# Patient Record
Sex: Male | Born: 1980
Health system: Southern US, Community
[De-identification: ages and names within clinical notes are randomized; demographics above are authoritative.]

## PROBLEM LIST (undated history)

## (undated) HISTORY — PX: WISDOM TOOTH EXTRACTION: SHX21

---

## 2002-10-15 ENCOUNTER — Emergency Department (HOSPITAL_COMMUNITY): Admission: EM | Admit: 2002-10-15 | Discharge: 2002-10-16 | Payer: Self-pay | Admitting: Emergency Medicine

## 2002-10-18 ENCOUNTER — Emergency Department (HOSPITAL_COMMUNITY): Admission: EM | Admit: 2002-10-18 | Discharge: 2002-10-18 | Payer: Self-pay | Admitting: Emergency Medicine

## 2002-11-01 ENCOUNTER — Emergency Department (HOSPITAL_COMMUNITY): Admission: EM | Admit: 2002-11-01 | Discharge: 2002-11-01 | Payer: Self-pay | Admitting: Emergency Medicine

## 2004-09-24 ENCOUNTER — Emergency Department (HOSPITAL_COMMUNITY): Admission: EM | Admit: 2004-09-24 | Discharge: 2004-09-24 | Payer: Self-pay | Admitting: Family Medicine

## 2004-09-25 ENCOUNTER — Emergency Department (HOSPITAL_COMMUNITY): Admission: EM | Admit: 2004-09-25 | Discharge: 2004-09-25 | Payer: Self-pay | Admitting: Family Medicine

## 2004-09-27 ENCOUNTER — Emergency Department (HOSPITAL_COMMUNITY): Admission: EM | Admit: 2004-09-27 | Discharge: 2004-09-27 | Payer: Self-pay | Admitting: Emergency Medicine

## 2008-08-28 ENCOUNTER — Emergency Department (HOSPITAL_COMMUNITY): Admission: EM | Admit: 2008-08-28 | Discharge: 2008-08-28 | Payer: Self-pay | Admitting: Emergency Medicine

## 2008-09-05 ENCOUNTER — Ambulatory Visit (HOSPITAL_BASED_OUTPATIENT_CLINIC_OR_DEPARTMENT_OTHER): Admission: RE | Admit: 2008-09-05 | Discharge: 2008-09-05 | Payer: Self-pay | Admitting: Otolaryngology
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
## 2014-12-22 ENCOUNTER — Ambulatory Visit
Admission: RE | Admit: 2014-12-22 | Discharge: 2014-12-22 | Disposition: A | Discharge status: Home / Self Care | Payer: 59 | Source: Ambulatory Visit | Attending: Internal Medicine | Admitting: Internal Medicine

## 2014-12-22 ENCOUNTER — Other Ambulatory Visit: Payer: Self-pay | Admitting: Internal Medicine

## 2014-12-22 DIAGNOSIS — Q5310 Unspecified undescended testicle, unilateral: Secondary | ICD-10-CM

## 2014-12-28 ENCOUNTER — Other Ambulatory Visit: Payer: Self-pay | Admitting: Internal Medicine

## 2014-12-28 DIAGNOSIS — Q5310 Unspecified undescended testicle, unilateral: Secondary | ICD-10-CM

## 2015-02-24 ENCOUNTER — Ambulatory Visit
Admission: RE | Admit: 2015-02-24 | Discharge: 2015-02-24 | Disposition: A | Discharge status: Home / Self Care | Payer: 59 | Source: Ambulatory Visit | Attending: Internal Medicine | Admitting: Internal Medicine

## 2015-02-24 DIAGNOSIS — Q5310 Unspecified undescended testicle, unilateral: Secondary | ICD-10-CM

## 2015-02-24 MED ORDER — IOPAMIDOL (ISOVUE-300) INJECTION 61%
100.0000 mL | Freq: Once | INTRAVENOUS | Status: AC | PRN
  Administered 2015-02-24: 100 mL via INTRAVENOUS

## 2015-02-26 ENCOUNTER — Other Ambulatory Visit: Payer: 59

## 2015-04-20 ENCOUNTER — Other Ambulatory Visit: Payer: Self-pay | Admitting: Urology

## 2015-05-15 NOTE — Patient Instructions (Signed)
Angelica Ranreston Jacober  05/15/2015   Your procedure is scheduled on: 05-19-15  [facility]  [facility]trance [facility]  elevators to 3rd floor to  Short Stay Center at  8:00 AM.  Call this number if you have problems the morning of surgery [phone]   Remember: ONLY 1 PERSON MAY GO WITH YOU TO SHORT STAY TO GET  READY MORNING OF YOUR SURGERY.  Do not eat food or drink liquids :After Midnight.     Take these medicines the morning of surgery with A SIP OF WATER: None DO NOT TAKE ANY DIABETIC MEDICATIONS DAY OF YOUR SURGERY                               You may not have any metal on your body including hair pins and              piercings  Do not wear jewelry,  lotions, powders or perfumes, deodorant                       Men may shave face and neck.   Do not bring valuables to the hospital. [facility] IS NOT             RESPONSIBLE   FOR VALUABLES.  Contacts, dentures or bridgework may not be worn into surgery.  Leave suitcase in the car. After surgery it may be brought to your room.       Special Instructions: coughing and deep breathing exercises, leg exercises              Please read over the following fact sheets you were given: _____________________________________________________________________             [facility] - Preparing for [facility] Before surgery, you can play an important role.  Because skin is not sterile, your skin needs to be as free of germs as possible.  You can reduce the number of germs on your skin by washing with CHG (chlorahexidine gluconate) soap before surgery.  CHG is an antiseptic cleaner which kills germs and bonds with the skin to continue killing germs even after washing. Please DO NOT use if you have an allergy to CHG or antibacterial soaps.  If your skin becomes reddened/irritated stop using the CHG and inform your nurse when you arrive at Short Stay. Do not shave (including legs and underarms) for at least 48 hours  prior to the first CHG shower.  You may shave your face/neck. Please follow these instructions carefully:  1.  Shower with CHG Soap the night before surgery and the  morning of Surgery.  2.  If you choose to wash your hair, wash your hair first as usual with your  normal  shampoo.  3.  After you shampoo, rinse your hair and body thoroughly to remove the  shampoo.                           4.  Use CHG as you would any other liquid soap.  You can apply chg directly  to the skin and wash                       Gently with a scrungie or clean washcloth.  5.  Apply the  CHG Soap to your body ONLY FROM THE NECK DOWN.   Do not use on face/ open                           Wound or open sores. Avoid contact with eyes, ears mouth and genitals (private parts).                       Wash face,  Genitals (private parts) with your normal soap.             6.  Wash thoroughly, paying special attention to the area where your surgery  will be performed.  7.  Thoroughly rinse your body with warm water from the neck down.  8.  DO NOT shower/wash with your normal soap after using and rinsing off  the CHG Soap.                9.  Pat yourself dry with a clean towel.            10.  Wear clean pajamas.            11.  Place clean sheets on your bed the night of your first shower and do not  sleep with pets. [name] : Do not apply any lotions/deodorants the morning of surgery.  Please wear clean clothes to the hospital/surgery center.  FAILURE TO FOLLOW THESE INSTRUCTIONS MAY RESULT IN THE CANCELLATION OF YOUR SURGERY PATIENT SIGNATURE_________________________________  NURSE SIGNATURE__________________________________  ________________________________________________________________________

## 2015-05-17 ENCOUNTER — Encounter (HOSPITAL_COMMUNITY)
Admission: RE | Admit: 2015-05-17 | Discharge: 2015-05-17 | Disposition: A | Discharge status: Home / Self Care | Payer: 59 | Source: Ambulatory Visit | Attending: Urology | Admitting: Urology

## 2015-05-17 ENCOUNTER — Encounter (HOSPITAL_COMMUNITY): Payer: Self-pay

## 2015-05-17 DIAGNOSIS — E785 Hyperlipidemia, unspecified: Secondary | ICD-10-CM | POA: Diagnosis not present

## 2015-05-17 DIAGNOSIS — F17200 Nicotine dependence, unspecified, uncomplicated: Secondary | ICD-10-CM | POA: Diagnosis not present

## 2015-05-17 DIAGNOSIS — Q5310 Unspecified undescended testicle, unilateral: Secondary | ICD-10-CM | POA: Diagnosis present

## 2015-05-17 LAB — CBC
HCT: 49.1 % (ref 39.0–52.0)
Hemoglobin: 16.6 g/dL (ref 13.0–17.0)
MCH: 30.1 pg (ref 26.0–34.0)
MCHC: 33.8 g/dL (ref 30.0–36.0)
MCV: 88.9 fL (ref 78.0–100.0)
PLATELETS: 200 10*3/uL (ref 150–400)
RBC: 5.52 MIL/uL (ref 4.22–5.81)
RDW: 12.7 % (ref 11.5–15.5)
WBC: 5.1 10*3/uL (ref 4.0–10.5)

## 2015-05-17 LAB — ABO/RH: ABO/RH(D): A POS

## 2015-05-17 NOTE — Progress Notes (Addendum)
02-24-15 - CT Abd/Pelvis - EPIC 12-22-14 - U/S Scrotum - EPIC

## 2015-05-19 ENCOUNTER — Ambulatory Visit (HOSPITAL_COMMUNITY): Payer: 59 | Admitting: Certified Registered Nurse Anesthetist

## 2015-05-19 ENCOUNTER — Encounter (HOSPITAL_COMMUNITY): Payer: Self-pay | Admitting: *Deleted

## 2015-05-19 ENCOUNTER — Encounter (HOSPITAL_COMMUNITY)
Admission: RE | Disposition: A | Discharge status: Home / Self Care | Payer: Self-pay | Source: Ambulatory Visit | Attending: Urology

## 2015-05-19 ENCOUNTER — Observation Stay (HOSPITAL_COMMUNITY)
Admission: RE | Admit: 2015-05-19 | Discharge: 2015-05-20 | Disposition: A | Discharge status: Home / Self Care | Payer: 59 | Source: Ambulatory Visit | Attending: Urology | Admitting: Urology

## 2015-05-19 DIAGNOSIS — Q539 Undescended testicle, unspecified: Secondary | ICD-10-CM

## 2015-05-19 DIAGNOSIS — Q5310 Unspecified undescended testicle, unilateral: Principal | ICD-10-CM | POA: Insufficient documentation

## 2015-05-19 DIAGNOSIS — E785 Hyperlipidemia, unspecified: Secondary | ICD-10-CM | POA: Insufficient documentation

## 2015-05-19 DIAGNOSIS — F17200 Nicotine dependence, unspecified, uncomplicated: Secondary | ICD-10-CM | POA: Insufficient documentation

## 2015-05-19 HISTORY — PX: ROBOTIC ORCHIECTOMY: SHX6209

## 2015-05-19 LAB — TYPE AND SCREEN
ABO/RH(D): A POS
Antibody Screen: NEGATIVE

## 2015-05-19 SURGERY — ROBOTIC ORCHIECTOMY
Anesthesia: General | Laterality: Left

## 2015-05-19 MED ORDER — SENNOSIDES-DOCUSATE SODIUM 8.6-50 MG PO TABS: 1.0000 | ORAL_TABLET | Freq: Two times a day (BID) | ORAL | Status: DC

## 2015-05-19 MED ORDER — ONDANSETRON HCL 4 MG/2ML IJ SOLN: 4.0000 mg | INTRAMUSCULAR | Status: DC | PRN

## 2015-05-19 MED ORDER — SODIUM CHLORIDE 0.9 % IV SOLN
250.0000 mL | INTRAVENOUS | Status: DC | PRN
  Administered 2015-05-20: 250 mL via INTRAVENOUS

## 2015-05-19 MED ORDER — LACTATED RINGERS IV SOLN: INTRAVENOUS | Status: DC

## 2015-05-19 MED ORDER — MIDAZOLAM HCL 2 MG/2ML IJ SOLN
INTRAMUSCULAR | Status: AC
  Filled 2015-05-19: qty 2

## 2015-05-19 MED ORDER — SODIUM CHLORIDE 0.9 % IJ SOLN: 3.0000 mL | Freq: Two times a day (BID) | INTRAMUSCULAR | Status: DC

## 2015-05-19 MED ORDER — SODIUM CHLORIDE 0.9 % IJ SOLN
INTRAMUSCULAR | Status: AC
  Filled 2015-05-19: qty 20

## 2015-05-19 MED ORDER — LIDOCAINE HCL (CARDIAC) 20 MG/ML IV SOLN
INTRAVENOUS | Status: AC
  Filled 2015-05-19: qty 5

## 2015-05-19 MED ORDER — LACTATED RINGERS IR SOLN
Status: DC | PRN
  Administered 2015-05-19: 1000 mL

## 2015-05-19 MED ORDER — DEXTROSE IN LACTATED RINGERS 5 % IV SOLN
INTRAVENOUS | Status: DC
  Administered 2015-05-19: 16:00:00 via INTRAVENOUS

## 2015-05-19 MED ORDER — CEFAZOLIN SODIUM 1-5 GM-% IV SOLN
1.0000 g | Freq: Three times a day (TID) | INTRAVENOUS | Status: AC
  Administered 2015-05-19 – 2015-05-20 (×2): 1 g via INTRAVENOUS
  Filled 2015-05-19 (×2): qty 50

## 2015-05-19 MED ORDER — ONDANSETRON HCL 4 MG/2ML IJ SOLN: 4.0000 mg | Freq: Once | INTRAMUSCULAR | Status: DC | PRN

## 2015-05-19 MED ORDER — ROCURONIUM BROMIDE 100 MG/10ML IV SOLN
INTRAVENOUS | Status: AC
  Filled 2015-05-19: qty 1

## 2015-05-19 MED ORDER — SUCCINYLCHOLINE CHLORIDE 20 MG/ML IJ SOLN
INTRAMUSCULAR | Status: DC | PRN
  Administered 2015-05-19: 100 mg via INTRAVENOUS

## 2015-05-19 MED ORDER — SENNOSIDES-DOCUSATE SODIUM 8.6-50 MG PO TABS
2.0000 | ORAL_TABLET | Freq: Every day | ORAL | Status: DC
  Administered 2015-05-19: 2 via ORAL
  Filled 2015-05-19: qty 2

## 2015-05-19 MED ORDER — ONDANSETRON HCL 4 MG/2ML IJ SOLN
INTRAMUSCULAR | Status: AC
  Filled 2015-05-19: qty 2

## 2015-05-19 MED ORDER — FENTANYL CITRATE (PF) 100 MCG/2ML IJ SOLN
INTRAMUSCULAR | Status: DC | PRN
  Administered 2015-05-19: 50 ug via INTRAVENOUS
  Administered 2015-05-19: 100 ug via INTRAVENOUS
  Administered 2015-05-19 (×2): 50 ug via INTRAVENOUS

## 2015-05-19 MED ORDER — DEXAMETHASONE SODIUM PHOSPHATE 10 MG/ML IJ SOLN
INTRAMUSCULAR | Status: AC
  Filled 2015-05-19: qty 1

## 2015-05-19 MED ORDER — FENTANYL CITRATE (PF) 100 MCG/2ML IJ SOLN
25.0000 ug | INTRAMUSCULAR | Status: DC | PRN
  Administered 2015-05-19 (×3): 50 ug via INTRAVENOUS

## 2015-05-19 MED ORDER — DOXYCYCLINE HYCLATE 100 MG PO TABS
100.0000 mg | ORAL_TABLET | Freq: Two times a day (BID) | ORAL | Status: DC
  Administered 2015-05-19 – 2015-05-20 (×3): 100 mg via ORAL
  Filled 2015-05-19 (×3): qty 1

## 2015-05-19 MED ORDER — FENTANYL CITRATE (PF) 100 MCG/2ML IJ SOLN
INTRAMUSCULAR | Status: AC
  Filled 2015-05-19: qty 2

## 2015-05-19 MED ORDER — ROCURONIUM BROMIDE 100 MG/10ML IV SOLN
INTRAVENOUS | Status: DC | PRN
  Administered 2015-05-19: 35 mg via INTRAVENOUS
  Administered 2015-05-19: 5 mg via INTRAVENOUS
  Administered 2015-05-19: 20 mg via INTRAVENOUS

## 2015-05-19 MED ORDER — HYDROMORPHONE HCL 1 MG/ML IJ SOLN
INTRAMUSCULAR | Status: DC | PRN
  Administered 2015-05-19 (×2): 0.5 mg via INTRAVENOUS
  Administered 2015-05-19: 1 mg via INTRAVENOUS

## 2015-05-19 MED ORDER — ONDANSETRON HCL 4 MG/2ML IJ SOLN
INTRAMUSCULAR | Status: DC | PRN
  Administered 2015-05-19: 4 mg via INTRAVENOUS

## 2015-05-19 MED ORDER — CEFAZOLIN SODIUM-DEXTROSE 2-3 GM-% IV SOLR
INTRAVENOUS | Status: AC
  Filled 2015-05-19: qty 50

## 2015-05-19 MED ORDER — POLYETHYLENE GLYCOL 3350 17 G PO PACK: 17.0000 g | PACK | Freq: Every day | ORAL | Status: DC | PRN

## 2015-05-19 MED ORDER — HYDROMORPHONE HCL 2 MG/ML IJ SOLN
INTRAMUSCULAR | Status: AC
  Filled 2015-05-19: qty 1

## 2015-05-19 MED ORDER — BACITRACIN-NEOMYCIN-POLYMYXIN 400-5-5000 EX OINT: 1.0000 "application " | TOPICAL_OINTMENT | Freq: Three times a day (TID) | CUTANEOUS | Status: DC | PRN

## 2015-05-19 MED ORDER — ACETAMINOPHEN 10 MG/ML IV SOLN
1000.0000 mg | Freq: Four times a day (QID) | INTRAVENOUS | Status: AC
  Administered 2015-05-19 – 2015-05-20 (×4): 1000 mg via INTRAVENOUS
  Filled 2015-05-19 (×5): qty 100

## 2015-05-19 MED ORDER — FENTANYL CITRATE (PF) 250 MCG/5ML IJ SOLN
INTRAMUSCULAR | Status: AC
  Filled 2015-05-19: qty 5

## 2015-05-19 MED ORDER — SUGAMMADEX SODIUM 200 MG/2ML IV SOLN
INTRAVENOUS | Status: DC | PRN
  Administered 2015-05-19: 160 mg via INTRAVENOUS

## 2015-05-19 MED ORDER — OXYCODONE-ACETAMINOPHEN 5-325 MG PO TABS: 1.0000 | ORAL_TABLET | ORAL | Status: DC | PRN

## 2015-05-19 MED ORDER — LIDOCAINE HCL (CARDIAC) 20 MG/ML IV SOLN
INTRAVENOUS | Status: DC | PRN
  Administered 2015-05-19: 100 mg via INTRAVENOUS

## 2015-05-19 MED ORDER — SUGAMMADEX SODIUM 200 MG/2ML IV SOLN
INTRAVENOUS | Status: AC
  Filled 2015-05-19: qty 2

## 2015-05-19 MED ORDER — PROPOFOL 10 MG/ML IV BOLUS
INTRAVENOUS | Status: DC | PRN
Start: 2015-05-19 — End: 2015-05-19
  Administered 2015-05-19: 200 mg via INTRAVENOUS

## 2015-05-19 MED ORDER — DEXAMETHASONE SODIUM PHOSPHATE 10 MG/ML IJ SOLN
INTRAMUSCULAR | Status: DC | PRN
  Administered 2015-05-19: 10 mg via INTRAVENOUS

## 2015-05-19 MED ORDER — BUPIVACAINE LIPOSOME 1.3 % IJ SUSP
20.0000 mL | Freq: Once | INTRAMUSCULAR | Status: AC
  Administered 2015-05-19: 20 mL
  Filled 2015-05-19: qty 20

## 2015-05-19 MED ORDER — OXYCODONE HCL 5 MG PO TABS
5.0000 mg | ORAL_TABLET | ORAL | Status: DC | PRN
  Administered 2015-05-19 – 2015-05-20 (×2): 10 mg via ORAL
  Filled 2015-05-19 (×3): qty 2

## 2015-05-19 MED ORDER — MIDAZOLAM HCL 5 MG/5ML IJ SOLN
INTRAMUSCULAR | Status: DC | PRN
  Administered 2015-05-19: 2 mg via INTRAVENOUS

## 2015-05-19 MED ORDER — LACTATED RINGERS IV SOLN
INTRAVENOUS | Status: DC | PRN
  Administered 2015-05-19 (×2): via INTRAVENOUS

## 2015-05-19 MED ORDER — PROPOFOL 10 MG/ML IV BOLUS
INTRAVENOUS | Status: AC
  Filled 2015-05-19: qty 20

## 2015-05-19 MED ORDER — CEFAZOLIN SODIUM-DEXTROSE 2-3 GM-% IV SOLR
2.0000 g | INTRAVENOUS | Status: AC
  Administered 2015-05-19: 2 g via INTRAVENOUS

## 2015-05-19 MED ORDER — MORPHINE SULFATE (PF) 10 MG/ML IV SOLN
2.0000 mg | INTRAVENOUS | Status: DC | PRN
  Administered 2015-05-19: 2 mg via INTRAVENOUS
  Filled 2015-05-19 (×2): qty 1

## 2015-05-19 MED ORDER — FENTANYL CITRATE (PF) 100 MCG/2ML IJ SOLN
INTRAMUSCULAR | Status: AC
Start: 2015-05-19 — End: 2015-05-20
  Filled 2015-05-19: qty 2

## 2015-05-19 MED ORDER — SODIUM CHLORIDE 0.9 % IJ SOLN: 3.0000 mL | INTRAMUSCULAR | Status: DC | PRN

## 2015-05-19 SURGICAL SUPPLY — 57 items
BAG RETRIEVAL 10 (BASKET)
BAG RETRIEVAL 10MM (BASKET)
CATH ROBINSON RED A/P 8FR (CATHETERS) IMPLANT
CHLORAPREP W/TINT 26ML (MISCELLANEOUS) ×3 IMPLANT
CLIP LIGATING HEM O LOK PURPLE (MISCELLANEOUS) ×6 IMPLANT
CLIP LIGATING HEMOLOK MED (MISCELLANEOUS) IMPLANT
CORD HIGH FREQUENCY UNIPOLAR (ELECTROSURGICAL) ×3 IMPLANT
COVER SURGICAL LIGHT HANDLE (MISCELLANEOUS) ×3 IMPLANT
COVER TIP SHEARS 8 DVNC (MISCELLANEOUS) ×1 IMPLANT
COVER TIP SHEARS 8MM DA VINCI (MISCELLANEOUS) ×2
CUTTER ECHEON FLEX ENDO 45 340 (ENDOMECHANICALS) IMPLANT
DECANTER SPIKE VIAL GLASS SM (MISCELLANEOUS) IMPLANT
DRAPE ARM DVNC X/XI (DISPOSABLE) ×4 IMPLANT
DRAPE COLUMN DVNC XI (DISPOSABLE) ×1 IMPLANT
DRAPE DA VINCI XI ARM (DISPOSABLE) ×8
DRAPE DA VINCI XI COLUMN (DISPOSABLE) ×2
DRAPE INCISE IOBAN 66X45 STRL (DRAPES) IMPLANT
DRAPE SHEET LG 3/4 BI-LAMINATE (DRAPES) ×6 IMPLANT
DRAPE SURG IRRIG POUCH 19X23 (DRAPES) ×3 IMPLANT
DRAPE TABLE BACK 44X90 PK DISP (DRAPES) IMPLANT
DRAPE WARM FLUID 44X44 (DRAPE) IMPLANT
DRSG TEGADERM 6X8 (GAUZE/BANDAGES/DRESSINGS) IMPLANT
ELECT REM PT RETURN 9FT ADLT (ELECTROSURGICAL) ×3
ELECTRODE REM PT RTRN 9FT ADLT (ELECTROSURGICAL) ×1 IMPLANT
GAUZE SPONGE 4X4 12PLY STRL (GAUZE/BANDAGES/DRESSINGS) IMPLANT
GLOVE BIO SURGEON STRL SZ 6.5 (GLOVE) ×2 IMPLANT
GLOVE BIO SURGEONS STRL SZ 6.5 (GLOVE) ×1
GLOVE BIOGEL M STRL SZ7.5 (GLOVE) ×6 IMPLANT
GOWN STRL REUS W/TWL LRG LVL3 (GOWN DISPOSABLE) ×6 IMPLANT
HOLDER FOLEY CATH W/STRAP (MISCELLANEOUS) ×3 IMPLANT
IV LACTATED RINGERS 1000ML (IV SOLUTION) IMPLANT
LUBRICANT JELLY K Y 4OZ (MISCELLANEOUS) IMPLANT
NDL SAFETY ECLIPSE 18X1.5 (NEEDLE) IMPLANT
NEEDLE HYPO 18GX1.5 SHARP (NEEDLE)
NEEDLE INSUFFLATION 14GA 120MM (NEEDLE) ×3 IMPLANT
RELOAD GREEN ECHELON 45 (STAPLE) IMPLANT
SEAL CANN UNIV 5-8 DVNC XI (MISCELLANEOUS) ×4 IMPLANT
SEAL XI 5MM-8MM UNIVERSAL (MISCELLANEOUS) ×8
SET TUBE IRRIG SUCTION NO TIP (IRRIGATION / IRRIGATOR) ×3 IMPLANT
SHEET LAVH (DRAPES) ×3 IMPLANT
SOLUTION ANTI FOG 6CC (MISCELLANEOUS) IMPLANT
SOLUTION ELECTROLUBE (MISCELLANEOUS) ×3 IMPLANT
SUT MON AB 4-0 RB1 27 (SUTURE) ×6 IMPLANT
SUT VIC AB 4-0 RB1 27 (SUTURE) ×2
SUT VIC AB 4-0 RB1 27XBRD (SUTURE) ×1 IMPLANT
SUT VICRYL 0 UR6 27IN ABS (SUTURE) ×6 IMPLANT
SYR 27GX1/2 1ML LL SAFETY (SYRINGE) IMPLANT
SYS BAG RETRIEVAL 10MM (BASKET)
SYSTEM BAG RETRIEVAL 10MM (BASKET) IMPLANT
TOWEL OR 17X26 10 PK STRL BLUE (TOWEL DISPOSABLE) ×3 IMPLANT
TOWEL OR NON WOVEN STRL DISP B (DISPOSABLE) ×3 IMPLANT
TRAY FOLEY W/METER SILVER 14FR (SET/KITS/TRAYS/PACK) IMPLANT
TRAY FOLEY W/METER SILVER 16FR (SET/KITS/TRAYS/PACK) ×3 IMPLANT
TRAY LAPAROSCOPIC (CUSTOM PROCEDURE TRAY) ×3 IMPLANT
TROCAR XCEL 12X100 BLDLESS (ENDOMECHANICALS) ×3 IMPLANT
TUBING INSUFFLATION 10FT LAP (TUBING) ×3 IMPLANT
WATER STERILE IRR 1500ML POUR (IV SOLUTION) IMPLANT

## 2015-05-19 NOTE — H&P (Signed)
Jeremiah Morrison is an 34 y.o. male.    Chief Complaint: Pre-Op Left Robotic Orchiectomy  HPI:   1 - Post-pubertal left undescended testis - pt with lifelong left cryptochidism. Scrotal US 12/2014 confirmed normal volume right testicle in scrotum  / non in left and left non-palpable. CT abd-pelvis 02/2015 with likely very small left testicle near external iliacs vessels on left. No retroperitoneal adenopathy. He has healthy daughter and normal secondary sexual charectaristics.   PMH sig for mild HLD. No blood thinners. No prior surgeires. His PCP is Dr. Gust BroomsJaralla with [facility].  Today " Jeremiah Morrison " is seen to proceed with left robotic orchiectomy of his in situ insta-abdominal testicle.   No past medical history on file.  Past Surgical History  Procedure Laterality Date  . Wisdom tooth extraction      No family history on file. Social History:  reports that he has been smoking.  He has never used smokeless tobacco. He reports that he does not drink alcohol or use illicit drugs.  Allergies: No Known Allergies  No prescriptions prior to admission    Results for orders placed or performed during the hospital encounter of 05/17/15 (from the past 48 hour(s))  Type and screen All Cardiac and thoracic surgeries, spinal fusions, myomectomies, craniotomies, colon & liver resections, total joint revisions, same day c-section with placenta previa or accreta.     Status: None   Collection Time: 05/17/15  9:55 AM  Result Value Ref Range   ABO/RH(D) A POS    Antibody Screen NEG    Sample Expiration 05/22/2015    Extend sample reason NO TRANSFUSIONS OR PREGNANCY IN THE PAST 3 MONTHS   ABO/Rh     Status: None   Collection Time: 05/17/15  9:55 AM  Result Value Ref Range   ABO/RH(D) A POS   CBC     Status: None   Collection Time: 05/17/15 10:00 AM  Result Value Ref Range   WBC 5.1 4.0 - 10.5 K/uL   RBC 5.52 4.22 - 5.81 MIL/uL   Hemoglobin 16.6 13.0 - 17.0 g/dL   HCT 16.149.1 09.639.0 - 04.552.0 %   MCV 88.9  78.0 - 100.0 fL   MCH 30.1 26.0 - 34.0 pg   MCHC 33.8 30.0 - 36.0 g/dL   RDW 40.912.7 81.111.5 - 91.415.5 %   Platelets 200 150 - 400 K/uL   No results found.  Review of Systems  Constitutional: Negative.   HENT: Negative.   Eyes: Negative.   Respiratory: Negative.   Cardiovascular: Negative.   Gastrointestinal: Negative.   Genitourinary: Negative.   Musculoskeletal: Negative.   Skin: Negative.   Neurological: Negative.   Endo/Heme/Allergies: Negative.   Psychiatric/Behavioral: Negative.     There were no vitals taken for this visit. Physical Exam  Constitutional: He appears well-developed.  HENT:  Head: Normocephalic.  Eyes: Pupils are equal, round, and reactive to light.  Neck: Normal range of motion.  Cardiovascular: Normal rate.   Respiratory: Effort normal.  GI: Soft.  Genitourinary:  Left testis NOT palpable in scrotum  Musculoskeletal: Normal range of motion.  Neurological: He is alert.  Skin: Skin is warm.  Psychiatric: He has a normal mood and affect. His behavior is normal. Judgment and thought content normal.     Assessment/Plan  1 - Post-pubertal left undescended testis - We rediscussed management of post-pubertal cryptorchid testis with the estimated >1/20 risk of future malignancy (mostly seminoma) which can be difficult to detect and may present at late stage due  to inability to perform self exam as well as the very low probability <1/20 of spermatogenic function and the rationale for management with orchiectomy preferred in unilateral cases with annual [place] surveillance until age 65 as another option.  We rediscussed that overall endocrine function tends to be completely preserved following unilateral orchiectomy in this setting and that fertility would be essentially unchanged as undescended gonads typically have no spermatogenesis present. We briefly rediscussed approaches to orchiectomy including inguinal approach for palpable gonads v. abdominal (robotic) for  non-palpable but confirmed gonads.    After consideration of options, the patient has opted for elective left laparoscopic / robotic orchiectomy today as planned.  Jeremiah Morrison 05/19/2015, 6:57 AM

## 2015-05-19 NOTE — Progress Notes (Signed)
[name] Subjective: Patient reports pain is moderately well controlled. Denies N/V. Tolerating clears. Has not ambulated yet. Voiding spontaneously.  Objective: Vital signs in last 24 hours: Temp:  [97.6 F (36.4 C)-98.1 F (36.7 C)] 97.6 F (36.4 C) (12/30 1300) Pulse Rate:  [76-85] 76 (12/30 1300) Resp:  [11-19] 14 (12/30 1300) BP: (112-127)/(66-82) 123/75 mmHg (12/30 1300) SpO2:  [97 %-100 %] 98 % (12/30 1300) Weight:  [78.926 kg (174 lb)] 78.926 kg (174 lb) (12/30 0844)  Intake/Output from previous day:   Intake/Output this shift: Total I/O In: 1400 [I.V.:1400] Out: 526 [Urine:526]  Physical Exam:  General:alert, cooperative and appears stated age GI: soft, appropriately tender to palpation, incisions C/D/I.   Male genitalia: no penile lesions or discharge Extremities: extremities normal, atraumatic, no cyanosis or edema  Lab Results:  Recent Labs  05/17/15 1000  HGB 16.6  HCT 49.1   BMET No results for input(s): NA, K, CL, CO2, GLUCOSE, BUN, CREATININE, CALCIUM in the last 72 hours. No results for input(s): LABPT, INR in the last 72 hours. No results for input(s): LABURIN in the last 72 hours. No results found for this or any previous visit.  Studies/Results: No results found.  Assessment/Plan: [name] Procedure(s) (LRB): ROBOTIC ORCHIECTOMY AND LIMITED LYMPH NODE DISSECTION (Left)  POD#0 left robotic orchiectomy and limited left pelvic lymph node dissection.    Clears, advance as tolerated  mIVF  Ambulate tonight  Labs tomorrow  Plan for discharge tomorrow   Jeremiah Morrison 05/19/2015, 3:12 PM   I have seen and examined the patient and agree with above assesment and plan as outlined here and in DC summary that I authored on same date.

## 2015-05-19 NOTE — Transfer of Care (Signed)
Immediate Anesthesia Transfer of Care Note  Patient: Jeremiah Morrison  Procedure(s) Performed: Procedure(s): ROBOTIC ORCHIECTOMY AND LIMITED LYMPH NODE DISSECTION (Left)  Patient Location: PACU  Anesthesia Type:General  Level of Consciousness:  sedated, patient cooperative and responds to stimulation  Airway & Oxygen Therapy:Patient Spontanous Breathing and Patient connected to face mask oxgen  Post-op Assessment:  Report given to PACU RN and Post -op Vital signs reviewed and stable  Post vital signs:  Reviewed and stable  Last Vitals:  Filed Vitals:   05/19/15 0803  BP: 127/72  Pulse: 83  Temp: 36.7 C  Resp: 18    Complications: No apparent anesthesia complications

## 2015-05-19 NOTE — Anesthesia Procedure Notes (Signed)
Procedure Name: Intubation Date/Time: 05/19/2015 10:24 AM Performed by: Orest DikesPETERS, Geordie Nooney J Pre-anesthesia Checklist: Patient identified, Emergency Drugs available, Suction available and Patient being monitored Patient Re-evaluated:Patient Re-evaluated prior to inductionOxygen Delivery Method: Circle system utilized Preoxygenation: Pre-oxygenation with 100% oxygen Intubation Type: IV induction Ventilation: Mask ventilation without difficulty Laryngoscope Size: Miller and 2 Grade View: Grade I Tube type: Oral Tube size: 7.5 mm Number of attempts: 1 Airway Equipment and Method: Stylet Placement Confirmation: ETT inserted through vocal cords under direct vision,  positive ETCO2 and breath sounds checked- equal and bilateral Secured at: 24 cm Tube secured with: Tape Dental Injury: Teeth and Oropharynx as per pre-operative assessment

## 2015-05-19 NOTE — Anesthesia Postprocedure Evaluation (Signed)
Anesthesia Post Note  Patient: Jeremiah Morrison  Procedure(s) Performed: Procedure(s) (LRB): ROBOTIC ORCHIECTOMY AND LIMITED LYMPH NODE DISSECTION (Left)  Patient location during evaluation: PACU Anesthesia Type: General Level of consciousness: awake and alert Pain management: pain level controlled Vital Signs Assessment: post-procedure vital signs reviewed and stable Respiratory status: spontaneous breathing, nonlabored ventilation, respiratory function stable and patient connected to nasal cannula oxygen Cardiovascular status: blood pressure returned to baseline and stable Postop Assessment: no signs of nausea or vomiting Anesthetic complications: no    Last Vitals:  Filed Vitals:   05/19/15 1203 05/19/15 1215  BP: 126/80 121/78  Pulse: 85 76  Temp: 36.6 C   Resp: 19 14    Last Pain:  Filed Vitals:   05/19/15 1227  PainSc: 7                  Lucine Bilski JENNETTE

## 2015-05-19 NOTE — Discharge Summary (Signed)
Physician Discharge Summary  Patient ID: Jeremiah Morrison MRN: [id number] DOB/AGE: 35/12/1980 35 y.o.  Admit date: 05/19/2015 Discharge date: 05/19/2015  Admission Diagnoses: Undescended, non-palpable left testicle  Discharge Diagnoses:  Active Problems:   Undescended testes   Discharged Condition: good  Hospital Course:  34 yo male who is s/p robotic assisted left orchiectomy and left limited pelvic lymph node dissection with Dr. Berneice HeinrichManny. He did well post-operatively. His diet was slowly advanced and at the time of discharge he was tolerating a regular diet, ambulating at his baseline, was voiding spontaneously without difficulty, and pain was well controlled with oral narcotics. He was discharged to home on POD#1.Final pathology pending at discharge.  Consults: None  Significant Diagnostic Studies: none  Treatments: surgery:  robotic assisted left orchiectomy and left limited pelvic lymph node dissection  Discharge Exam: Blood pressure 127/72, pulse 83, temperature 98 F (36.7 C), temperature source Oral, resp. rate 18, height 5\' 10"  (1.778 m), weight 78.926 kg (174 lb), SpO2 98 %. General appearance: alert, cooperative and appears stated age Head: Normocephalic, without obvious abnormality, atraumatic Eyes: negative Nose: Nares normal. Septum midline. Mucosa normal. No drainage or sinus tenderness. Throat: lips, mucosa, and tongue normal; teeth and gums normal Neck: supple, symmetrical, trachea midline Back: symmetric, no curvature. ROM normal. No CVA tenderness. Resp: non-labored on room air Cardio: Nl rate GI: soft, non-tender; bowel sounds normal; no masses,  no organomegaly Male genitalia: normal Extremities: extremities normal, atraumatic, no cyanosis or edema Pulses: 2+ and symmetric Skin: Skin color, texture, turgor normal. No rashes or lesions Lymph nodes: Cervical, supraclavicular, and axillary nodes normal. Neurologic: Grossly normal Incision/Wound: recent port  sites c/d/i. No hernias. Left hemiscrotum empty w/o ecchymoses / hematoma.   Disposition: Final discharge disposition not confirmed  Discharge Instructions    Call MD for:  difficulty breathing, headache or visual disturbances    Complete by:  As directed      Call MD for:  extreme fatigue    Complete by:  As directed      Call MD for:  hives    Complete by:  As directed      Call MD for:  persistant dizziness or light-headedness    Complete by:  As directed      Call MD for:  persistant nausea and vomiting    Complete by:  As directed      Call MD for:  redness, tenderness, or signs of infection (pain, swelling, redness, odor or green/yellow discharge around incision site)    Complete by:  As directed      Call MD for:  severe uncontrolled pain    Complete by:  As directed      Call MD for:  temperature >100.4    Complete by:  As directed      Diet general    Complete by:  As directed      Increase activity slowly    Complete by:  As directed           Take  Percocet and Senakot as prescribed as needed.      Follow-up Information    Follow up with Sebastian AcheMANNY, Selena Swaminathan, MD On 06/06/2015.   Specialty:  Urology   Why:  at 7:45 for MD visit. Dr. Berneice HeinrichManny will call you wtih pathology results when available.    Contact information:   [address] [place] [address] [phone]       Signed: Darron Doomlysia S Spencer 05/19/2015, 11:52 AM

## 2015-05-19 NOTE — Anesthesia Preprocedure Evaluation (Signed)
Anesthesia Evaluation  Patient identified by MRN, date of birth, ID band Patient awake    Reviewed: Allergy & Precautions, NPO status , Patient's Chart, lab work & pertinent test results  History of Anesthesia Complications Negative for: history of anesthetic complications  Airway Mallampati: II  TM Distance: >3 FB Neck ROM: Full    Dental no notable dental hx. (+) Dental Advisory Given   Pulmonary Current Smoker,    Pulmonary exam normal breath sounds clear to auscultation       Cardiovascular negative cardio ROS Normal cardiovascular exam Rhythm:Regular Rate:Normal     Neuro/Psych negative neurological ROS  negative psych ROS   GI/Hepatic negative GI ROS, Neg liver ROS,   Endo/Other  negative endocrine ROS  Renal/GU negative Renal ROS  negative genitourinary   Musculoskeletal negative musculoskeletal ROS (+)   Abdominal   Peds negative pediatric ROS (+)  Hematology negative hematology ROS (+)   Anesthesia Other Findings   Reproductive/Obstetrics negative OB ROS                             Anesthesia Physical Anesthesia Plan  ASA: II  Anesthesia Plan: General   Post-op Pain Management:    Induction: Intravenous  Airway Management Planned: Oral ETT  Additional Equipment:   Intra-op Plan:   Post-operative Plan: Extubation in OR  Informed Consent: I have reviewed the patients History and Physical, chart, labs and discussed the procedure including the risks, benefits and alternatives for the proposed anesthesia with the patient or authorized representative who has indicated his/her understanding and acceptance.   Dental advisory given  Plan Discussed with: CRNA  Anesthesia Plan Comments:         Anesthesia Quick Evaluation  

## 2015-05-19 NOTE — Brief Op Note (Signed)
05/19/2015  11:40 AM  PATIENT:  Jeremiah RanPreston Morrison  34 y.o. male  PRE-OPERATIVE DIAGNOSIS:  LEFT ABDOMINAL TESTICLE  POST-OPERATIVE DIAGNOSIS:  LEFT ABDOMINAL TESTICLE  PROCEDURE:  Procedure(s): ROBOTIC ORCHIECTOMY AND LIMITED LYMPH NODE DISSECTION (Left)  SURGEON:  Surgeon(s) and Role:    * Sebastian Acheheodore Aissata Wilmore, MD - Primary  PHYSICIAN ASSISTANT:   ASSISTANTS: Emily FilbertSophie Spencer MD   ANESTHESIA:   local and general  EBL:  Total I/O In: -  Out: 175 [Urine:175]  BLOOD ADMINISTERED:none  DRAINS: none   LOCAL MEDICATIONS USED:  MARCAINE     SPECIMEN:  Source of Specimen:  1 - left testicular nubbin wtih distal vas + vessels, 2 - left external iliac lymph node  DISPOSITION OF SPECIMEN:  PATHOLOGY  COUNTS:  YES  TOURNIQUET:  * No tourniquets in log *  DICTATION: .Other Dictation: Dictation Number [id number]  PLAN OF CARE: Admit for overnight observation  PATIENT DISPOSITION:  PACU - hemodynamically stable.   Delay start of Pharmacological VTE agent (>24hrs) due to surgical blood loss or risk of bleeding: yes

## 2015-05-19 NOTE — Discharge Instructions (Signed)

## 2015-05-20 DIAGNOSIS — Q5310 Unspecified undescended testicle, unilateral: Secondary | ICD-10-CM | POA: Diagnosis not present

## 2015-05-20 LAB — BASIC METABOLIC PANEL
Anion gap: 9 (ref 5–15)
BUN: 13 mg/dL (ref 6–20)
CALCIUM: 8.9 mg/dL (ref 8.9–10.3)
CO2: 29 mmol/L (ref 22–32)
CREATININE: 0.85 mg/dL (ref 0.61–1.24)
Chloride: 102 mmol/L (ref 101–111)
Glucose, Bld: 126 mg/dL — ABNORMAL HIGH (ref 65–99)
Potassium: 4.2 mmol/L (ref 3.5–5.1)
SODIUM: 140 mmol/L (ref 135–145)

## 2015-05-20 LAB — HEMOGLOBIN AND HEMATOCRIT, BLOOD
HEMATOCRIT: 44.2 % (ref 39.0–52.0)
HEMOGLOBIN: 15.2 g/dL (ref 13.0–17.0)

## 2015-05-20 NOTE — Progress Notes (Signed)
Pt is a&ox4, ambulatory. Discharge instruction reviewed, pt denied questions and or concerns.

## 2015-05-20 NOTE — Op Note (Signed)
Jeremiah Morrison, AUDINO NO.:  [id number]  MEDICAL RECORD NO.:  [id number]  LOCATION:  1429                         FACILITY:  [facility]  PHYSICIAN:  Sebastian Ache, MD     DATE OF BIRTH:  May 13, 1981  DATE OF PROCEDURE: 05/19/2015                               OPERATIVE REPORT   DIAGNOSIS:  Post pubertal left undescended testicle.  PROCEDURE: 1. Robotic-assisted laparoscopic left orchiectomy. 2. Left limited pelvic lymph node dissection.  ASSISTANT:  Emily Filbert, M.D.  ESTIMATED BLOOD LOSS:  Nil.  COMPLICATION:  None.  SPECIMEN: 1. Left testicular nubbin with distal vas and vessels. 2. Left external iliac lymph node with permanent pathology.  FINDINGS: 1. Tiny likely testicular nubbin 3-4 mm in diameter at the end of     atretic left gonadal vessels and vas deferens.  This was just at     the level of the left internal inguinal ring. 2. Approximately 2 cm left external iliac lymph node, likely     corresponding to abnormality on prior CT.  This set aside     separately from pathology.  INDICATION:  Jeremiah Morrison is a very pleasant, 34 year old gentleman with a lifelong history of left-sided empty scrotum.  He had previously been told he was never born with a left testicle.  He recently established a new primary care physician.  He was more concerned about the possibility of intraabdominal testicle.  CT scan was obtained, which did reveal a likely left intraabdominal testicle at the level of the external iliac vessels.  Given his post-pubertal status, we discussed options for management including surveillance versus orchiectomy with a goal of ruling out or detecting early malignancy, and he wished to proceed with orchiectomy.  The contralateral testicle is palpably normal.  He has had no endocrine sequela in his father and 2 healthy children.  Informed consent was obtained and placed in medical record.  PROCEDURE IN DETAIL:  The patient being [facility], was verified. Procedure being left robotic-assisted laparoscopic orchiectomy was confirmed.  Procedure was carried out.  Time-out was performed. Intravenous antibiotics were administered.  General endotracheal anesthesia introduced.  The patient placed into a low lithotomy position.  A sterile field was created by prepping and draping the patient's penis, perineum, and proximal thighs using iodine and his infra-xiphoid abdomen using chlorhexidine gluconate.  After first clipper shaving, his arms were tucked to the side using gel rolls at his wrists and elbows.  He was further fashioned to the operative table using 3-inch tape over foam padding across his chest.  A test of steep Trendelenburg positioning was performed.  He was found to be suitably positioned.  Foley catheter was placed per urethra to straight drain. Next, a high-flow, low-pressure pneumoperitoneum was obtained using Veress technique in the supraumbilical midline having passed the aspiration and drop test.  Next, an 8-mm robotic camera port was placed in this location.  Inspection of the peritoneal cavity revealed no significant adhesions, no visceral injury, no obvious carcinomatosis or worrisome masses.  Additional ports were then placed as follows:  Left paramedian 8-mm robotic port, right paramedian 8-mm robotic port, and right far-lateral 12-mm assistant port.  Robot was docked and  passed through electronic checks.  Initial attention was directed at the left retroperitoneum.  Incision was made lateral to the descending colon in the area of the crossing of the iliac vessels and the posterior peritoneal flap was obtained to help sweep the colon medially.  Vas deferens was encountered and visualized tracking toward the inguinal ring as were the gonadal vessels.  The left vas was doubly clipped and ligated as were the left gonadal vessels, which were quite atretic.  The structures were then followed towards the  area of the internal ring, but the vas became very atretic as did the gonadal vessels.  These structures terminated into what appeared to be a small peritoneal sac containing a likely 3-4 mm testicular nubbin.  It was carefully mobilized free from surrounding tissues and this en bloc specimen containing the perspective left testicular nubbin, distal vas, and vessels were satisfied for permanent pathology, labeled as such.  A stitch was placed at the nubbin site.  The patient's preoperative CT scan had revealed what appeared to be a more substantial likely testicular structure at the level of the iliac vessels, and therefore, the left external iliac artery was carefully mobilized and visualized directly from the area lateral to the ureter towards the area of the internal ring.  A single dominant approximately 2 cm lymph node was clearly noted in this location and likely corresponded to the abnormality on CT.  This was dissected free.  Lymphostasis was achieved with cold clips and this likely lymph node was set aside for permanent pathology, labeled as such.  Following these maneuvers, hemostasis appeared excellent.  All sponge and needle counts were correct.  We achieved the goals of procedure today.  The previous 12-mm assistant port site was closed with fascia using Carter-Thomason suture passer and Vicryl.  Robot was then undocked.  All incision sites were infiltrated with dilute lyophilized Marcaine and closed at the level of the skin using subcuticular Monocryl followed by Dermabond.  Procedure was then terminated and Foley catheter was removed also at the conclusion of general anesthesia.  The patient tolerated the procedure well with no immediate periprocedural complications.  The patient was taken to the Postanesthesia Care Unit in stable condition with plan for observation admission.          ______________________________ Sebastian Acheheodore Itali Mckendry, MD     TM/MEDQ  D:  05/19/2015   T:  05/19/2015  Job:  [id number]

## 2015-05-20 NOTE — Progress Notes (Signed)
Utilization Review Completed.Dayja Loveridge T12/31/2016  

## 2015-08-01 ENCOUNTER — Encounter: Payer: Self-pay | Admitting: Pediatrics

## 2015-08-01 ENCOUNTER — Ambulatory Visit (INDEPENDENT_AMBULATORY_CARE_PROVIDER_SITE_OTHER): Payer: Worker's Compensation | Admitting: Pediatrics

## 2015-08-01 VITALS — BP 123/73 | HR 76 | Temp 97.4°F | Ht 70.0 in | Wt 175.0 lb

## 2015-08-01 DIAGNOSIS — S81002A Unspecified open wound, left knee, initial encounter: Secondary | ICD-10-CM

## 2015-08-01 DIAGNOSIS — Z23 Encounter for immunization: Secondary | ICD-10-CM | POA: Diagnosis not present

## 2015-08-01 DIAGNOSIS — IMO0002 Reserved for concepts with insufficient information to code with codable children: Secondary | ICD-10-CM

## 2015-08-01 NOTE — Progress Notes (Addendum)
    Subjective:    Patient ID: Jeremiah Morrison, male    DOB: 12/29/1980, 35 y.o.   MRN: [id number]  CC: Workers comp   HPI: Jeremiah Morrison is a 35 y.o. male presenting for Workers comp  L leg with laceration from [employer] knife, works [employer] Happened earlier today Last tetanus more than 5 yrs ago   ROS: Per HPI unless specifically indicated above     Objective:    BP 123/73 mmHg  Pulse 76  Temp(Src) 97.4 F (36.3 C) (Oral)  Ht 5\' 10"  (1.778 m)  Wt 175 lb (79.379 kg)  BMI 25.11 kg/m2  Wt Readings from Last 3 Encounters:  08/01/15 175 lb (79.379 kg)  05/19/15 174 lb (78.926 kg)  05/17/15 174 lb (78.926 kg)     Gen: NAD, alert, cooperative with exam, NCAT Skin: L knee with apprx 1 cm straight edged laceration, some bleeding     Assessment & Plan:    Jeremiah Morrison was seen today for workers comp, L leg laceration.  Diagnoses and all orders for this visit:  Laceration  Tetanus shot today  Procedure: Wound cleaned with copious amounts of normal saline Betadine used to clean area 3mL of lidocaine with epi used to numb area Two interrupted sutures, 3.0 nylon used to close wound Pt tolerated procedure well Wound covered with antibiotic ointment Return in 10 days for stitch removal Care of wound discussed  Follow up plan: Return in about 10 days (around 08/11/2015).  Rex Krasarol Vincent, MD [facility] 08/01/2015, 5:54 PM

## 2015-08-01 NOTE — Patient Instructions (Signed)
Return in 10 days to take out stitches

## 2015-08-02 DIAGNOSIS — Z23 Encounter for immunization: Secondary | ICD-10-CM | POA: Diagnosis not present

## 2015-08-02 DIAGNOSIS — S81002A Unspecified open wound, left knee, initial encounter: Secondary | ICD-10-CM | POA: Diagnosis not present

## 2015-08-04 ENCOUNTER — Other Ambulatory Visit: Payer: Self-pay | Admitting: Pediatrics

## 2015-08-11 ENCOUNTER — Other Ambulatory Visit: Payer: Self-pay | Admitting: Pediatrics

## 2015-09-19 DIAGNOSIS — S82891A Other fracture of right lower leg, initial encounter for closed fracture: Secondary | ICD-10-CM | POA: Diagnosis not present

## 2015-09-22 DIAGNOSIS — S82891A Other fracture of right lower leg, initial encounter for closed fracture: Secondary | ICD-10-CM | POA: Diagnosis not present

## 2015-12-07 DIAGNOSIS — M7051 Other bursitis of knee, right knee: Secondary | ICD-10-CM | POA: Diagnosis not present

## 2015-12-07 DIAGNOSIS — Z0001 Encounter for general adult medical examination with abnormal findings: Secondary | ICD-10-CM | POA: Diagnosis not present

## 2015-12-07 DIAGNOSIS — Z113 Encounter for screening for infections with a predominantly sexual mode of transmission: Secondary | ICD-10-CM | POA: Diagnosis not present

## 2015-12-12 DIAGNOSIS — Z113 Encounter for screening for infections with a predominantly sexual mode of transmission: Secondary | ICD-10-CM | POA: Diagnosis not present

## 2015-12-12 DIAGNOSIS — M25461 Effusion, right knee: Secondary | ICD-10-CM | POA: Diagnosis not present

## 2015-12-22 DIAGNOSIS — B079 Viral wart, unspecified: Secondary | ICD-10-CM | POA: Diagnosis not present

## 2015-12-22 DIAGNOSIS — B078 Other viral warts: Secondary | ICD-10-CM | POA: Diagnosis not present

## 2016-01-24 DIAGNOSIS — B079 Viral wart, unspecified: Secondary | ICD-10-CM | POA: Diagnosis not present

## 2016-02-19 DIAGNOSIS — S61215A Laceration without foreign body of left ring finger without damage to nail, initial encounter: Secondary | ICD-10-CM | POA: Diagnosis not present

## 2016-09-05 DIAGNOSIS — B078 Other viral warts: Secondary | ICD-10-CM | POA: Diagnosis not present

## 2016-12-17 DIAGNOSIS — E785 Hyperlipidemia, unspecified: Secondary | ICD-10-CM | POA: Diagnosis not present

## 2016-12-17 DIAGNOSIS — Z113 Encounter for screening for infections with a predominantly sexual mode of transmission: Secondary | ICD-10-CM | POA: Diagnosis not present

## 2016-12-17 DIAGNOSIS — Z23 Encounter for immunization: Secondary | ICD-10-CM | POA: Diagnosis not present

## 2016-12-17 DIAGNOSIS — Z0000 Encounter for general adult medical examination without abnormal findings: Secondary | ICD-10-CM | POA: Diagnosis not present

## 2017-01-08 DIAGNOSIS — D1801 Hemangioma of skin and subcutaneous tissue: Secondary | ICD-10-CM | POA: Diagnosis not present

## 2017-01-17 DIAGNOSIS — Z23 Encounter for immunization: Secondary | ICD-10-CM | POA: Diagnosis not present

## 2017-02-19 DIAGNOSIS — J309 Allergic rhinitis, unspecified: Secondary | ICD-10-CM | POA: Diagnosis not present

## 2017-04-22 DIAGNOSIS — Z23 Encounter for immunization: Secondary | ICD-10-CM | POA: Diagnosis not present

## 2017-05-01 DIAGNOSIS — B078 Other viral warts: Secondary | ICD-10-CM | POA: Diagnosis not present

## 2017-05-18 DIAGNOSIS — J9801 Acute bronchospasm: Secondary | ICD-10-CM | POA: Diagnosis not present

## 2017-05-18 DIAGNOSIS — B349 Viral infection, unspecified: Secondary | ICD-10-CM | POA: Diagnosis not present

## 2017-05-23 IMAGING — US US SCROTUM
1 series · 14 of 25 positions shown · non-contrast
Comparison: None.

CLINICAL DATA: Undescended left testicle

EXAM:
ULTRASOUND OF SCROTUM
TECHNIQUE: Complete ultrasound examination of the testicles, epididymis, and
other scrotal structures was performed.

[Series 1: us scrotum · 0.08mm/px · 14 of 28 slices shown]
[im 1/28]
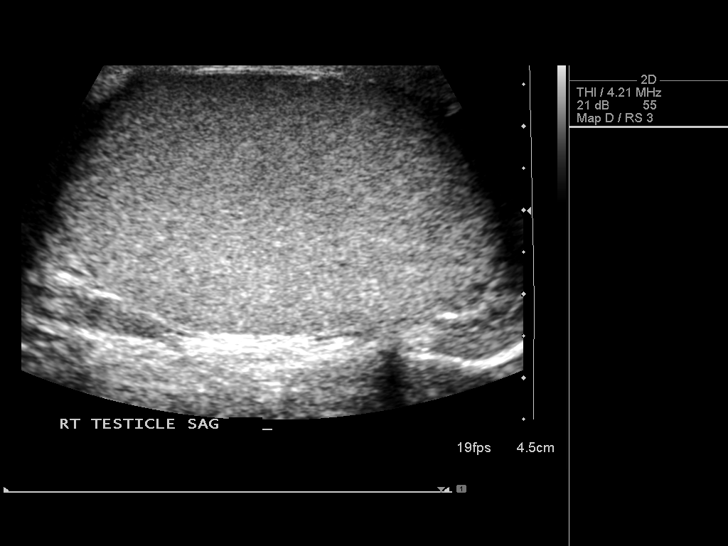
[im 3/28]
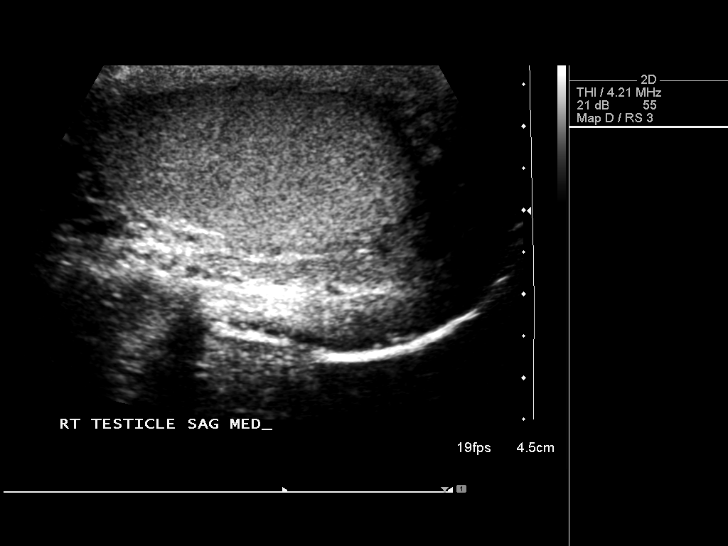
[im 5/28]
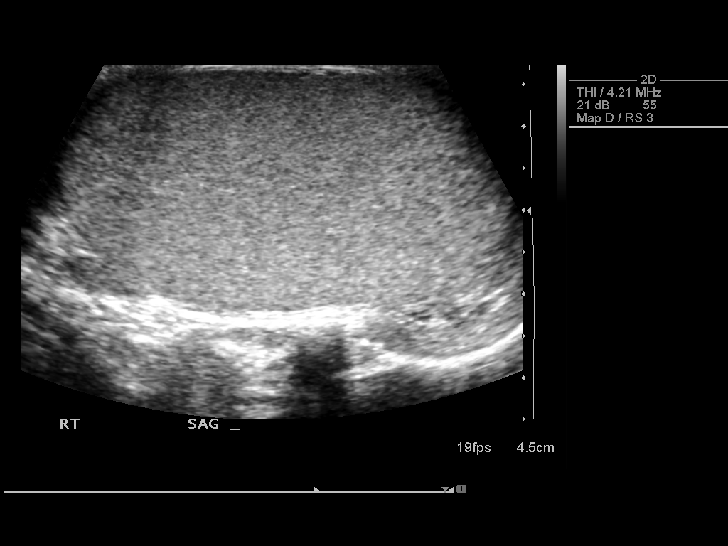
[im 7/28]
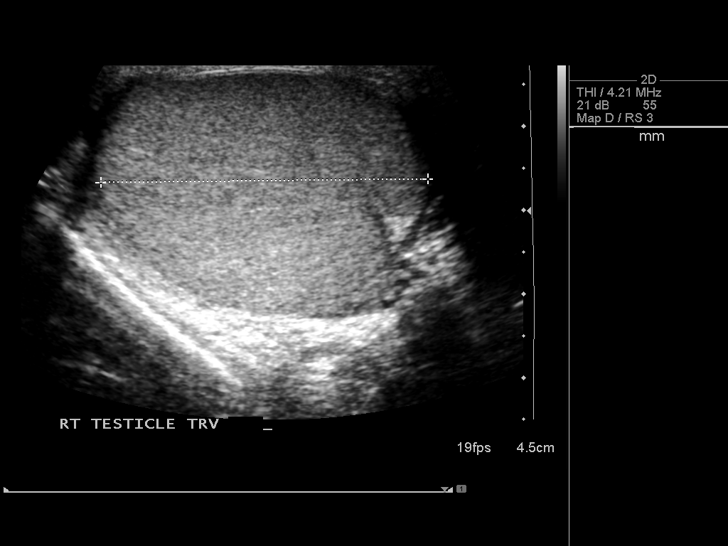
[im 10/28]
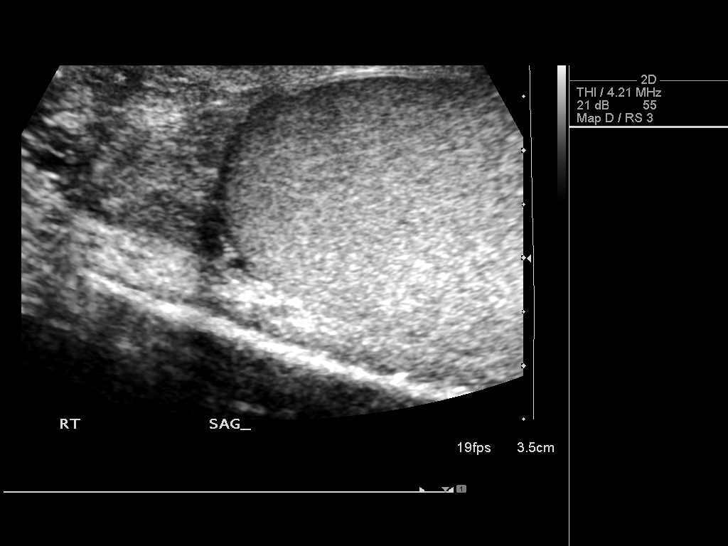
[im 11/28]
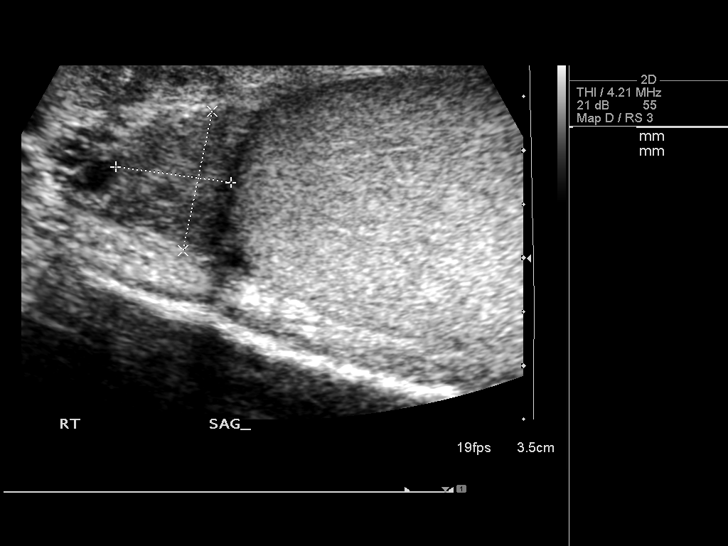
[im 13/28]
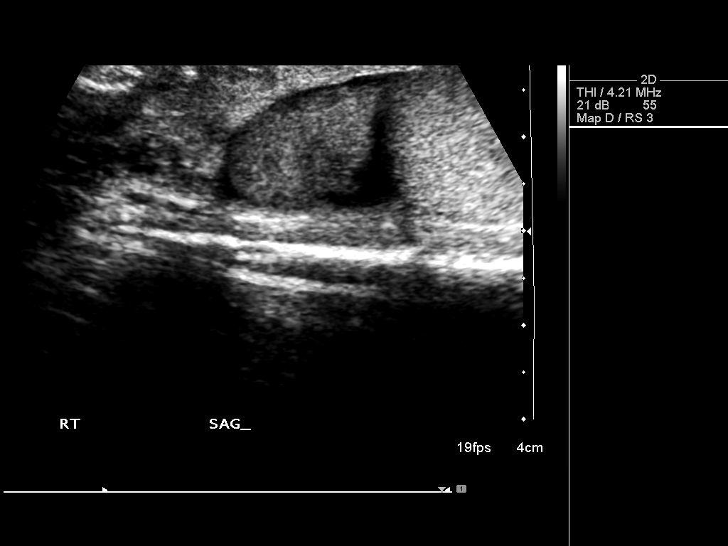
[im 15/28]
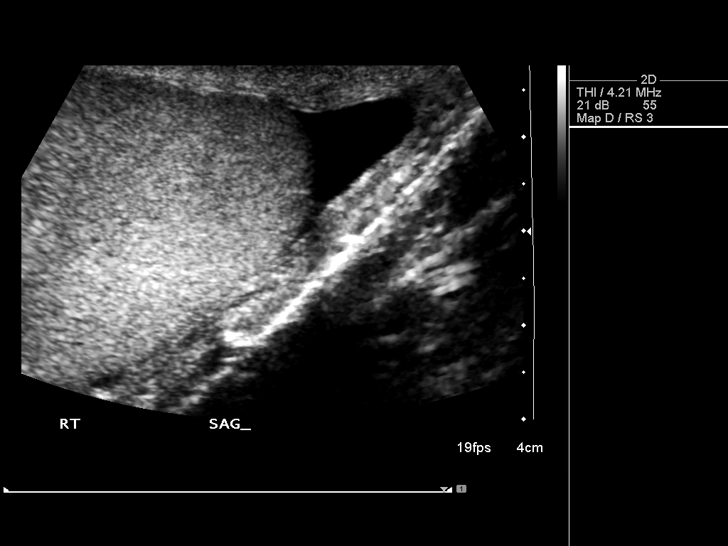
[im 17/28]
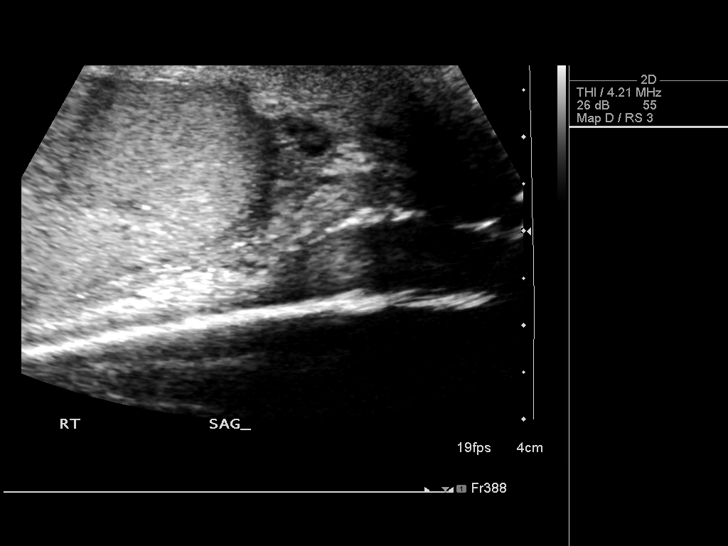
[im 19/28]
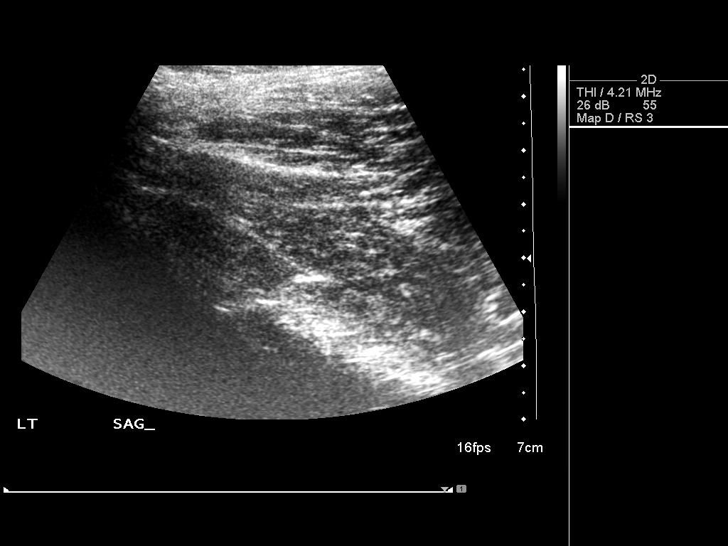
[im 21/28]
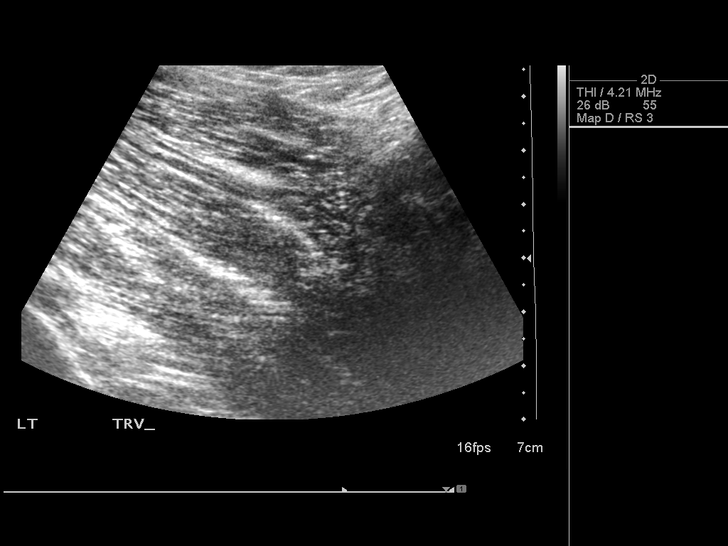
[im 23/28]
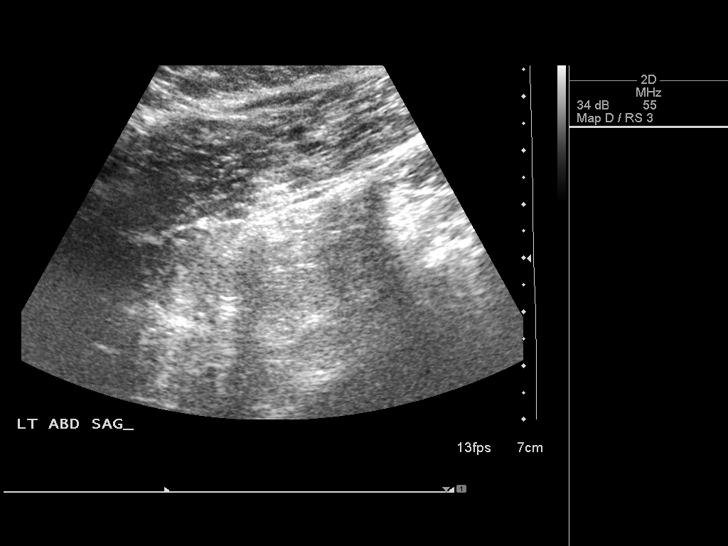
[im 25/28]
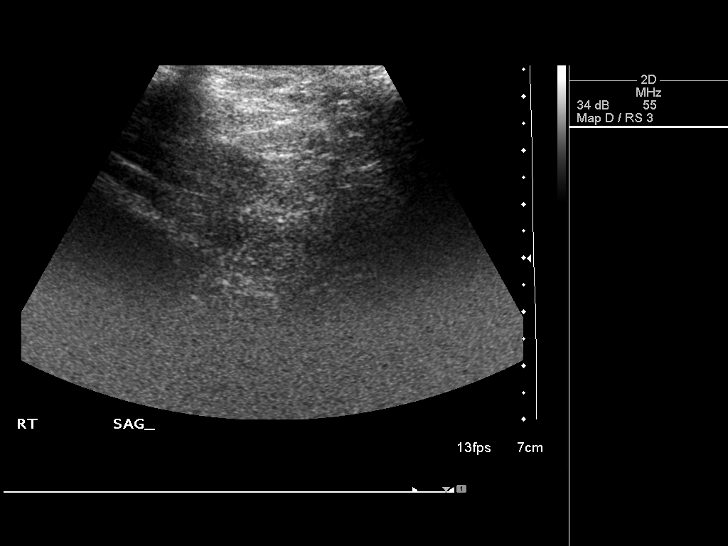
[im 28/28]
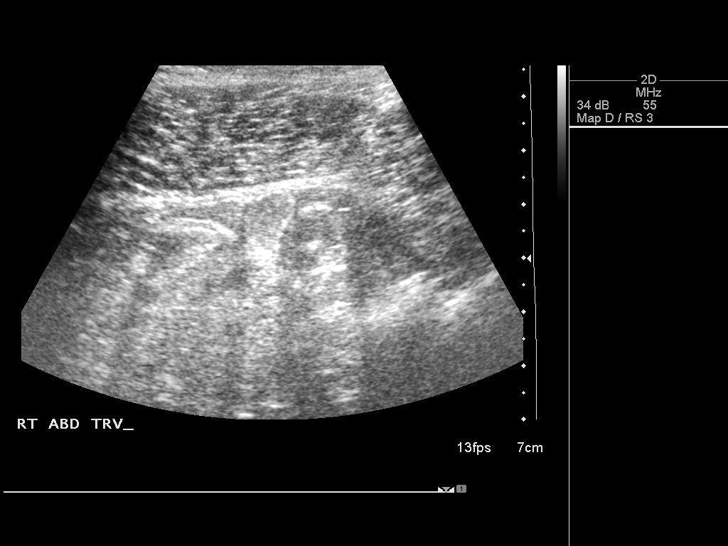

[14 of 25 positions shown; findings below may reference images not displayed]

FINDINGS: Right testicle

Measurements: 5.6 x 3.9 x 3.0 cm. No mass or microlithiasis
visualized.

Left testicle

Measurements:  Not visualized.

Right epididymis:  Normal in size and appearance.

Left epididymis:  Not visualized

Hydrocele:  None visualized.

Varicocele:  None visualized.
IMPRESSION: No right-sided intratesticular mass or focal abnormality.

Nonvisualization of left testis. Pelvic MRI may be helpful for
further localization for purposes of presumed surgical treatment due
to potential risk for malignant transformation in an undescended
testis.

## 2017-06-25 DIAGNOSIS — S61011A Laceration without foreign body of right thumb without damage to nail, initial encounter: Secondary | ICD-10-CM | POA: Diagnosis not present

## 2017-07-03 ENCOUNTER — Encounter: Payer: Self-pay | Admitting: Internal Medicine

## 2017-07-16 ENCOUNTER — Encounter: Payer: Self-pay | Admitting: Physician Assistant

## 2017-07-16 ENCOUNTER — Ambulatory Visit: Payer: BLUE CROSS/BLUE SHIELD | Admitting: Physician Assistant

## 2017-07-16 VITALS — BP 120/70 | HR 91 | Ht 69.75 in | Wt 191.0 lb

## 2017-07-16 DIAGNOSIS — A630 Anogenital (venereal) warts: Secondary | ICD-10-CM | POA: Diagnosis not present

## 2017-07-16 NOTE — Progress Notes (Signed)
I agree with the above note, plan 

## 2017-07-16 NOTE — Patient Instructions (Signed)
If you are age 38 or older, your body mass index should be between 23-30. Your Body mass index is 27.6 kg/m. If this is out of the aforementioned range listed, please consider follow up with your Primary Care Provider.  If you are age 38 or younger, your body mass index should be between 19-25. Your Body mass index is 27.6 kg/m. If this is out of the aformentioned range listed, please consider follow up with your Primary Care Provider.   You have been scheduled for a flexible sigmoidoscopy. Please follow the written instructions given to you at your visit today. If you use inhalers (even only as needed), please bring them with you on the day of your procedure.  Thank you for choosing [facility]

## 2017-07-16 NOTE — Progress Notes (Signed)
Chief Complaint: Anal condyloma  HPI:    [name] is a 37 year old male who was referred to me by Jeremiah Ket, PA-C at the skin surgical center, for a complaint of anal condyloma.      Review of outside records shows patient was originally seen for perianal lesion in August 2017 which was biopsied and revealed verruca with atypia and he was later evaluated by Dr. Jeanella Morrison.  He was prescribed imiquimod and used every other day times 4 weeks.  The lesions resolved but then new places "pop up".  Patient has had cryosurgery on this area multiple times.  Also been prescribed Aldara 5% cream 3 times a week for 16 weeks.    Today, tells me he noticed these "spots" a couple of years ago, he has been placing "various creams on them" and also having cryosurgery a couple times to get rid of the spots.  He is worried regarding the possibility of cancer as they "keep coming back".  Patient is unaware of how he got these spots and denies any autoimmune history.    Denies fever, chills, blood in his stool, weight loss, anorexia, nausea, vomiting or change in bowel habits.  Past Medical and Past Surgical History:  Procedure Laterality Date  . ROBOTIC ORCHIECTOMY Left 05/19/2015   Procedure: ROBOTIC ORCHIECTOMY AND LIMITED LYMPH NODE DISSECTION;  Surgeon: Jeremiah Ache, MD;  Location: [facility];  Service: Urology;  Laterality: Left;  . WISDOM TOOTH EXTRACTION     Medications:  Aldara ointment 3x/wk x16wks  Family History: No family history of colon cancer or polyps  Social History   Socioeconomic History  . Marital status: Single    Spouse name: Not on file  . Number of children: Not on file  . Years of education: Not on file  . Highest education level: Not on file  Social Needs  . Financial resource strain: Not on file  . Food insecurity - worry: Not on file  . Food insecurity - inability: Not on file  . Transportation needs - medical: Not on file  . Transportation needs - non-medical: Not on  file  Occupational History  . Not on file  Tobacco Use  . Smoking status: Current Every Day Smoker    Packs/day: 0.25  . Smokeless tobacco: Never Used  Substance and Sexual Activity  . Alcohol use: No  . Drug use: No  . Sexual activity: Not on file  Other Topics Concern  . Not on file  Social History Narrative  . Not on file   Review of Systems:    Constitutional: No weight loss, fever pr chills Skin: +anal lesions Cardiovascular: No chest pain Respiratory: No SOB Gastrointestinal: See HPI and otherwise negative Genitourinary: No dysuria  Neurological: No headache Musculoskeletal: No new muscle or joint pain Hematologic: No bleeding Psychiatric: No history of depression or anxiety   Physical Exam:  Vital signs: BP 120/70   Pulse 91   Ht 5' 9.75" (1.772 m)   Wt 191 lb (86.6 kg)   BMI 27.60 kg/m   Constitutional:   Pleasant male appears to be in NAD, Well developed, Well nourished, alert and cooperative Head:  Normocephalic and atraumatic. Eyes:   PEERL, EOMI. No icterus. Conjunctiva pink. Ears:  Normal auditory acuity. Neck:  Supple Throat: Oral cavity and pharynx without inflammation, swelling or lesion.  Respiratory: Respirations even and unlabored. Lungs clear to auscultation bilaterally.   No wheezes, crackles, or rhonchi.  Cardiovascular: Normal S1, S2. No MRG. Regular rate and  rhythm. No peripheral edema, cyanosis or pallor.  Gastrointestinal:  Soft, nondistended, nontender. No rebound or guarding. Normal bowel sounds. No appreciable masses or hepatomegaly. Rectal:  External exam: 2 veruca, no hemorrhoids or other abnormality; Anoscopy: no abnormalities Msk:  Symmetrical without gross deformities. Without edema, no deformity or joint abnormality.  Neurologic:  Alert and  oriented x4;  grossly normal neurologically.  Skin:   Dry and intact without significant lesions or rashes. Psychiatric:  Demonstrates good judgement and reason without abnormal affect or  behaviors.  No recent labs or imaging.  Assessment: 1.  Anal verruca: Followed by the skin center, sent for recurrence and question of "internal condyloma",  anoscopy normal today.  Plan: 1.  Discussed case with Dr. Christella HartiganJacobs.  Scheduled patient for a flex sigmoidoscopy.  Discussed risk, benefits, limitations alternatives and the patient agrees to proceed.  Patient was given next available appointment the beginning of April, but requests to be put on a cancellation list. 2.  Patient to follow in clinic per recommendations from Dr. Christella HartiganJacobs after time of procedure.  Jeremiah MeekerJennifer Lemmon, PA-C [facility] 07/16/2017, 9:56 AM  Cc: Johna SheriffVincent, Carol L, MD

## 2017-08-18 ENCOUNTER — Encounter: Payer: Self-pay | Admitting: Gastroenterology

## 2017-08-26 ENCOUNTER — Other Ambulatory Visit: Payer: Self-pay

## 2017-08-26 ENCOUNTER — Encounter: Payer: Self-pay | Admitting: Gastroenterology

## 2017-08-26 ENCOUNTER — Ambulatory Visit (AMBULATORY_SURGERY_CENTER): Payer: BLUE CROSS/BLUE SHIELD | Admitting: Gastroenterology

## 2017-08-26 VITALS — BP 107/59 | HR 74 | Temp 99.1°F | Resp 14 | Ht 69.0 in | Wt 191.0 lb

## 2017-08-26 DIAGNOSIS — A630 Anogenital (venereal) warts: Secondary | ICD-10-CM | POA: Diagnosis not present

## 2017-08-26 MED ORDER — SODIUM CHLORIDE 0.9 % IV SOLN: 500.0000 mL | Freq: Once | INTRAVENOUS | Status: AC

## 2017-08-26 NOTE — Patient Instructions (Signed)
YOU HAD AN ENDOSCOPIC PROCEDURE TODAY AT THE [facility]:   Refer to the procedure report that was given to you for any specific questions about what was found during the examination.  If the procedure report does not answer your questions, please call your gastroenterologist to clarify.  If you requested that your care partner not be given the details of your procedure findings, then the procedure report has been included in a sealed envelope for you to review at your convenience later.  YOU SHOULD EXPECT: Some feelings of bloating in the abdomen. Passage of more gas than usual.  Walking can help get rid of the air that was put into your GI tract during the procedure and reduce the bloating. If you had a lower endoscopy (such as a colonoscopy or flexible sigmoidoscopy) you may notice spotting of blood in your stool or on the toilet paper. If you underwent a bowel prep for your procedure, you may not have a normal bowel movement for a few days.  Please Note:  You might notice some irritation and congestion in your nose or some drainage.  This is from the oxygen used during your procedure.  There is no need for concern and it should clear up in a day or so.  SYMPTOMS TO REPORT IMMEDIATELY:   Following lower endoscopy (colonoscopy or flexible sigmoidoscopy):  Excessive amounts of blood in the stool  Significant tenderness or worsening of abdominal pains  Swelling of the abdomen that is new, acute  Fever of 100F or higher   Following upper endoscopy (EGD)  Vomiting of blood or coffee ground material  New chest pain or pain under the shoulder blades  Painful or persistently difficult swallowing  New shortness of breath  Fever of 100F or higher  Black, tarry-looking stools  For urgent or emergent issues, a gastroenterologist can be reached at any hour by calling [phone].   DIET:  We do recommend a small meal at first, but then you may proceed to your regular diet.  Drink  plenty of fluids but you should avoid alcoholic beverages for 24 hours.  ACTIVITY:  You should plan to take it easy for the rest of today and you should NOT DRIVE or use heavy machinery until tomorrow (because of the sedation medicines used during the test).    FOLLOW UP: Our staff will call the number listed on your records the next business day following your procedure to check on you and address any questions or concerns that you may have regarding the information given to you following your procedure. If we do not reach you, we will leave a message.  However, if you are feeling well and you are not experiencing any problems, there is no need to return our call.  We will assume that you have returned to your regular daily activities without incident.  If any biopsies were taken you will be contacted by phone or by letter within the next 1-3 weeks.  Please call us at [phone] if you have not heard about the biopsies in 3 weeks.    SIGNATURES/CONFIDENTIALITY: You and/or your care partner have signed paperwork which will be entered into your electronic medical record.  These signatures attest to the fact that that the information above on your After Visit Summary has been reviewed and is understood.  Full responsibility of the confidentiality of this discharge information lies with you and/or your care-partner. 

## 2017-08-26 NOTE — Op Note (Signed)
[facility] Patient Name: Jeremiah Morrison Procedure Date: 08/26/2017 9:47 AM MRN: [id number] Endoscopist: Rachael Fee , MD Age: 37 Referring MD:  Date of Birth: Feb 10, 1981 Gender: Male Account #: [id number] Procedure:                Flexible Sigmoidoscopy Indications:              anal condyloma; Skin Center provider asked if an                            "internal component" to the condyloma. Medicines:                Monitored Anesthesia Care Procedure:                Pre-Anesthesia Assessment:                           - Prior to the procedure, a History and Physical                            was performed, and patient medications and                            allergies were reviewed. The patient's tolerance of                            previous anesthesia was also reviewed. The risks                            and benefits of the procedure and the sedation                            options and risks were discussed with the patient.                            All questions were answered, and informed consent                            was obtained. Prior Anticoagulants: The patient has                            taken no previous anticoagulant or antiplatelet                            agents. ASA Grade Assessment: II - A patient with                            mild systemic disease. After reviewing the risks                            and benefits, the patient was deemed in                            satisfactory condition to undergo the procedure.  After obtaining informed consent, the scope was                            passed under direct vision. The Colonoscope was                            introduced through the anus and advanced to the the                            splenic flexure. The flexible sigmoidoscopy was                            accomplished without difficulty. The patient                            tolerated the procedure  well. The quality of the                            bowel preparation was good. Scope In: Scope Out: Findings:                 The entire examined colon appeared normal.                           Normal anus (internally and externally). Complications:            No immediate complications. Estimated blood loss:                            None. Estimated Blood Loss:     Estimated blood loss: none. Impression:               - The entire examined colon is normal including                            good views of anus (internally and externally).                           - No specimens collected. Recommendation:           - Patient has a contact number available for                            emergencies. The signs and symptoms of potential                            delayed complications were discussed with the                            patient. Return to normal activities tomorrow.                            Written discharge instructions were provided to the                            patient.                           -  Resume previous diet.                           - Follow up with your Skin Center provider for anal                            condyloma. Rachael Fee, MD 08/26/2017 10:05:43 AM This report has been signed electronically.

## 2017-08-26 NOTE — Progress Notes (Signed)
To PACU, VSS. Report to RN.tb 

## 2017-08-27 ENCOUNTER — Telehealth: Payer: Self-pay | Admitting: *Deleted

## 2017-08-27 ENCOUNTER — Telehealth: Payer: Self-pay

## 2017-08-27 NOTE — Telephone Encounter (Signed)
NO ANSWER, MESSAGE LEFT FOR PATIENT. 

## 2017-08-27 NOTE — Telephone Encounter (Signed)
No answer. Number identifier. Message left to call if questions or concerns. 

## 2017-09-23 ENCOUNTER — Encounter

## 2017-09-23 ENCOUNTER — Ambulatory Visit: Payer: 59 | Admitting: Internal Medicine

## 2017-12-24 DIAGNOSIS — E785 Hyperlipidemia, unspecified: Secondary | ICD-10-CM | POA: Diagnosis not present

## 2017-12-24 DIAGNOSIS — Z0000 Encounter for general adult medical examination without abnormal findings: Secondary | ICD-10-CM | POA: Diagnosis not present

## 2018-03-20 DIAGNOSIS — Z23 Encounter for immunization: Secondary | ICD-10-CM | POA: Diagnosis not present

## 2018-05-11 DIAGNOSIS — Z23 Encounter for immunization: Secondary | ICD-10-CM | POA: Diagnosis not present

## 2018-08-03 DIAGNOSIS — A630 Anogenital (venereal) warts: Secondary | ICD-10-CM | POA: Diagnosis not present

## 2018-10-05 DIAGNOSIS — L810 Postinflammatory hyperpigmentation: Secondary | ICD-10-CM | POA: Diagnosis not present

## 2018-10-05 DIAGNOSIS — D1801 Hemangioma of skin and subcutaneous tissue: Secondary | ICD-10-CM | POA: Diagnosis not present

## 2018-10-19 DIAGNOSIS — Z23 Encounter for immunization: Secondary | ICD-10-CM | POA: Diagnosis not present

## 2019-02-02 DIAGNOSIS — K649 Unspecified hemorrhoids: Secondary | ICD-10-CM | POA: Diagnosis not present

## 2019-12-21 DIAGNOSIS — Z0000 Encounter for general adult medical examination without abnormal findings: Secondary | ICD-10-CM | POA: Diagnosis not present

## 2019-12-21 DIAGNOSIS — E785 Hyperlipidemia, unspecified: Secondary | ICD-10-CM | POA: Diagnosis not present
